# Patient Record
Sex: Female | Born: 1980 | Race: Asian | Hispanic: No | Marital: Married | Smoking: Never smoker
Health system: Southern US, Community
[De-identification: ages and names within clinical notes are randomized; demographics above are authoritative.]

## PROBLEM LIST (undated history)

## (undated) DIAGNOSIS — F410 Panic disorder [episodic paroxysmal anxiety] without agoraphobia: Secondary | ICD-10-CM

---

## 2018-03-23 ENCOUNTER — Emergency Department (HOSPITAL_COMMUNITY)
Admission: EM | Admit: 2018-03-23 | Discharge: 2018-03-23 | Disposition: A | Discharge status: Home / Self Care | Payer: BLUE CROSS/BLUE SHIELD | Attending: Emergency Medicine | Admitting: Emergency Medicine

## 2018-03-23 ENCOUNTER — Encounter (HOSPITAL_COMMUNITY): Payer: Self-pay

## 2018-03-23 ENCOUNTER — Other Ambulatory Visit: Payer: Self-pay

## 2018-03-23 ENCOUNTER — Emergency Department (HOSPITAL_COMMUNITY): Payer: BLUE CROSS/BLUE SHIELD

## 2018-03-23 DIAGNOSIS — Z3202 Encounter for pregnancy test, result negative: Secondary | ICD-10-CM | POA: Insufficient documentation

## 2018-03-23 DIAGNOSIS — R404 Transient alteration of awareness: Secondary | ICD-10-CM | POA: Diagnosis not present

## 2018-03-23 DIAGNOSIS — E876 Hypokalemia: Secondary | ICD-10-CM | POA: Insufficient documentation

## 2018-03-23 DIAGNOSIS — R1013 Epigastric pain: Secondary | ICD-10-CM | POA: Diagnosis not present

## 2018-03-23 DIAGNOSIS — F430 Acute stress reaction: Secondary | ICD-10-CM | POA: Diagnosis not present

## 2018-03-23 DIAGNOSIS — R0602 Shortness of breath: Secondary | ICD-10-CM | POA: Diagnosis not present

## 2018-03-23 DIAGNOSIS — D72829 Elevated white blood cell count, unspecified: Secondary | ICD-10-CM | POA: Insufficient documentation

## 2018-03-23 DIAGNOSIS — R0902 Hypoxemia: Secondary | ICD-10-CM | POA: Diagnosis not present

## 2018-03-23 DIAGNOSIS — R0689 Other abnormalities of breathing: Secondary | ICD-10-CM | POA: Diagnosis not present

## 2018-03-23 DIAGNOSIS — R55 Syncope and collapse: Secondary | ICD-10-CM | POA: Insufficient documentation

## 2018-03-23 HISTORY — DX: Panic disorder (episodic paroxysmal anxiety): F41.0

## 2018-03-23 LAB — URINALYSIS, ROUTINE W REFLEX MICROSCOPIC
Bilirubin Urine: NEGATIVE
Glucose, UA: 50 mg/dL — AB
Hgb urine dipstick: NEGATIVE
KETONES UR: NEGATIVE mg/dL
Nitrite: NEGATIVE
PROTEIN: NEGATIVE mg/dL
Specific Gravity, Urine: 1.011 (ref 1.005–1.030)
pH: 7 (ref 5.0–8.0)

## 2018-03-23 LAB — CBC
HEMATOCRIT: 44.1 % (ref 36.0–46.0)
Hemoglobin: 13.7 g/dL (ref 12.0–15.0)
MCH: 27.5 pg (ref 26.0–34.0)
MCHC: 31.1 g/dL (ref 30.0–36.0)
MCV: 88.6 fL (ref 80.0–100.0)
Platelets: 173 10*3/uL (ref 150–400)
RBC: 4.98 MIL/uL (ref 3.87–5.11)
RDW: 12 % (ref 11.5–15.5)
WBC: 17 10*3/uL — ABNORMAL HIGH (ref 4.0–10.5)
nRBC: 0 % (ref 0.0–0.2)

## 2018-03-23 LAB — BASIC METABOLIC PANEL
Anion gap: 10 (ref 5–15)
BUN: 15 mg/dL (ref 6–20)
CO2: 22 mmol/L (ref 22–32)
CREATININE: 0.82 mg/dL (ref 0.44–1.00)
Calcium: 9.2 mg/dL (ref 8.9–10.3)
Chloride: 108 mmol/L (ref 98–111)
GFR calc Af Amer: >60 mL/min (ref >60)
GFR calc non Af Amer: >60 mL/min (ref >60)
Glucose, Bld: 170 mg/dL — ABNORMAL HIGH (ref 70–99)
Potassium: 3.2 mmol/L — ABNORMAL LOW (ref 3.5–5.1)
Sodium: 140 mmol/L (ref 135–145)

## 2018-03-23 LAB — CBG MONITORING, ED: Glucose-Capillary: 152 mg/dL — ABNORMAL HIGH (ref 70–99)

## 2018-03-23 LAB — I-STAT BETA HCG BLOOD, ED (MC, WL, AP ONLY): I-stat hCG, quantitative: <5 m[IU]/mL (ref <5)

## 2018-03-23 LAB — D-DIMER, QUANTITATIVE: D-Dimer, Quant: 0.31 ug/mL-FEU (ref 0.00–0.50)

## 2018-03-23 MED ORDER — HYDROXYZINE HCL 25 MG PO TABS: 25.0000 mg | ORAL_TABLET | Freq: Four times a day (QID) | ORAL | 0 refills | Status: DC

## 2018-03-23 MED ORDER — PANTOPRAZOLE SODIUM 20 MG PO TBEC: 20.0000 mg | DELAYED_RELEASE_TABLET | Freq: Every day | ORAL | 0 refills | Status: AC

## 2018-03-23 MED ORDER — POTASSIUM CHLORIDE CRYS ER 20 MEQ PO TBCR
40.0000 meq | EXTENDED_RELEASE_TABLET | Freq: Once | ORAL | Status: AC
  Administered 2018-03-23: 40 meq via ORAL
  Filled 2018-03-23: qty 2

## 2018-03-23 NOTE — ED Provider Notes (Signed)
[facility]-EMERGENCY DEPT Provider Note   CSN: [id number] Arrival date & time: 03/23/18  1501     History   Chief Complaint Chief Complaint  Patient presents with  . Loss of Consciousness  . Panic Attack    HPI Linda Neal is a 37 y.o. female.  HPI  Patient is a 37 year old female with no significant past medical history presenting for episode of shortness of breath, diaphoresis, and syncope versus near syncopal episode.  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  Patient is a IT consultant at [employer].  Patient reports that she was walking with her professor and could not keep up due to having some epigastric pain.  She reports that she frequently gets this pain.  She reports that she had a busy morning, grading papers and trying to complete them on time.  Patient reports that her husband came to pick her up due to the epigastric discomfort she was experiencing and took her to the store.  During this time, patient became suddenly short of breath, and her husband reports that while in the car she "looked like she could not breathe".  EMS report stating that patient had a syncopal episode on the stretcher.  On presentation emergency department, patient reported she is back to baseline.  She is no longer experiencing abdominal pain or shortness of breath.  No chest pain. But she does report that she has been under significant stress due to completing her graduate program as well as the duties of teaching.  She reports that she has a history of similar episodes, however they happen infrequently, she does not know what triggers them.  No psychiatric history.  Patient denies any recent mobilization, hospitalization, hormone use, cancer treatment, history DVT/PE, lower extremity edema or calf tenderness, or hemoptysis.  No recent illnesses.  Past Medical History:  Diagnosis Date  . Panic attack     There are no active problems to display  for this patient.   Past Surgical History:  Procedure Laterality Date  . CESAREAN SECTION       OB History   No obstetric history on file.      Home Medications    Prior to Admission medications   Not on File    Family History Family History  Problem Relation Age of Onset  . Hypertension Mother   . Hypertension Father     Social History Social History   Tobacco Use  . Smoking status: Never Smoker  . Smokeless tobacco: Never Used  Substance Use Topics  . Alcohol use: Never    Frequency: Never  . Drug use: Never     Allergies   Patient has no known allergies.   Review of Systems Review of Systems  Constitutional: Negative for chills and fever.  HENT: Negative for congestion, rhinorrhea, sinus pain and sore throat.   Eyes: Negative for visual disturbance.  Respiratory: Positive for shortness of breath. Negative for cough and chest tightness.   Cardiovascular: Negative for chest pain, palpitations and leg swelling.  Gastrointestinal: Negative for abdominal pain, nausea and vomiting.  Genitourinary: Negative for dysuria and flank pain.  Musculoskeletal: Negative for back pain and myalgias.  Skin: Negative for rash.  Neurological: Negative for dizziness, syncope, light-headedness and headaches.       +Near syncope     Physical Exam Updated Vital Signs BP 107/64   Pulse 77   Temp 98.4 F (36.9 C) (Oral)   Resp  17   Ht 5\' 3"  (1.6 m)   Wt 45.4 kg   LMP 03/07/2018   SpO2 100%   BMI 17.71 kg/m   Physical Exam Vitals signs and nursing note reviewed.  Constitutional:      General: She is not in acute distress.    Appearance: She is well-developed.  HENT:     Head: Normocephalic and atraumatic.  Eyes:     Conjunctiva/sclera: Conjunctivae normal.     Pupils: Pupils are equal, round, and reactive to light.  Neck:     Musculoskeletal: Normal range of motion and neck supple.  Cardiovascular:     Rate and Rhythm: Normal rate and regular rhythm.      Heart sounds: S1 normal and S2 normal. No murmur.     Comments: No lower extremity edema.  No calf tenderness. Pulmonary:     Effort: Pulmonary effort is normal.     Breath sounds: Normal breath sounds. No wheezing or rales.  Abdominal:     General: There is no distension.     Palpations: Abdomen is soft.     Tenderness: There is no abdominal tenderness. There is no guarding.  Musculoskeletal: Normal range of motion.        General: No deformity.  Lymphadenopathy:     Cervical: No cervical adenopathy.  Skin:    General: Skin is warm and dry.     Findings: No erythema or rash.  Neurological:     Mental Status: She is alert.     Comments: Cranial nerves grossly intact. Patient moves extremities symmetrically and with good coordination.  Psychiatric:        Behavior: Behavior normal.        Thought Content: Thought content normal.        Judgment: Judgment normal.      ED Treatments / Results  Labs (all labs ordered are listed, but only abnormal results are displayed) Labs Reviewed  BASIC METABOLIC PANEL - Abnormal; Notable for the following components:      Result Value   Potassium 3.2 (*)    Glucose, Bld 170 (*)    All other components within normal limits  CBC - Abnormal; Notable for the following components:   WBC 17.0 (*)    All other components within normal limits  URINALYSIS, ROUTINE W REFLEX MICROSCOPIC - Abnormal; Notable for the following components:   Glucose, UA 50 (*)    Leukocytes, UA SMALL (*)    Bacteria, UA RARE (*)    All other components within normal limits  CBG MONITORING, ED - Abnormal; Notable for the following components:   Glucose-Capillary 152 (*)    All other components within normal limits  D-DIMER, QUANTITATIVE (NOT AT [facility])  I-STAT BETA HCG BLOOD, ED ([facility], [facility], [facility] ONLY)    EKG EKG Interpretation  Date/Time:  Thursday March 23 2018 17:13:13 EST Ventricular Rate:  85 PR Interval:    QRS Duration: 84 QT Interval:  377 QTC  Calculation: 449 R Axis:   72 Text Interpretation:  Sinus rhythm RSR' in V1 or V2, probably normal variant No old tracing to compare Confirmed by Mancel Bale [phone]) on 03/23/2018 6:08:08 PM   Radiology Dg Chest 2 View  Result Date: 03/23/2018 CLINICAL DATA:  Shortness of breath EXAM: CHEST - 2 VIEW COMPARISON:  None. FINDINGS: The heart size and mediastinal contours are within normal limits. Both lungs are clear. The visualized skeletal structures are unremarkable. IMPRESSION: No active cardiopulmonary disease. Electronically Signed   By:  Jasmine PangKim  Fujinaga M.D.   On: 03/23/2018 19:29    Procedures Procedures (including critical care time)  Medications Ordered in ED Medications  potassium chloride SA (K-DUR,KLOR-CON) CR tablet 40 mEq (40 mEq Oral Given 03/23/18 1854)     Initial Impression / Assessment and Plan / ED Course  I have reviewed the triage vital signs and the nursing notes.  Pertinent labs & imaging results that were available during my care of the patient were reviewed by me and considered in my medical decision making (see chart for details).  Clinical Course as of Mar 25 119  Thu Mar 23, 2018  1705 No history of diabetes.   Glucose(!): 170 [AM]  2027 Repleted.   Potassium(!): 3.2 [AM]    Clinical Course User Index [AM] Elisha PonderMurray, Araf Clugston B, PA-C    Patient nontoxic-appearing, afebrile, hemodynamically stable my evaluation.  Differential diagnosis includes panic attack, pulmonary embolism, dyspepsia, vasovagal reaction.  Patient does not appear to have infectious symptoms today.  Patient is back to baseline in no acute distress at rest.  She is reporting some continued sensation of slight shortness of breath when ambulating.  Work-up revealing leukocytosis of 17.  No prior for comparison.  Possible stress reaction, however recommended follow-up to patient with PCP.  Patient had hyperglycemia as well as small amount of glucose in the urine.  Recommended repeat follow-up  with PCP to rule out prediabetes/diabetes.  Patient is not pregnant.  Slight hypokalemia, repleted.  D-dimer negative.  Chest x-ray without acute cardiopulmonary abnormality.  EKG, with no prior for comparison demonstrating no acute ischemic findings or arrhythmia.  Patient was ambulated, and had a pulse rate of 94 with oxygen saturation to 95.  Patient remained above 95% oxygen saturation otherwise.  Do not feel that patient is at further risk for pulmonary bills at this time and is fully cleared from a cardiopulmonary perspective.  Patient struck to establish care with a primary care provider soon as possible, as well as follow-up [facility].  Patient given hydroxyzine is symptomatic treatment for any episodes of increased rest or panic.  Patient also started on proton pump inhibitor given history clinically consistent with dyspepsia.   Final Clinical Impressions(s) / ED Diagnoses   Final diagnoses:  Shortness of breath  Leukocytosis, unspecified type  Hypokalemia  Acute stress reaction    ED Discharge Orders         Ordered    hydrOXYzine (ATARAX/VISTARIL) 25 MG tablet  Every 6 hours     03/23/18 2034    pantoprazole (PROTONIX) 20 MG tablet  Daily     03/23/18 2034           Elisha PonderMurray, Ellia Knowlton B, PA-C 03/24/18 0132    Mancel BaleWentz, Elliott, MD 03/24/18 1312

## 2018-03-23 NOTE — ED Triage Notes (Signed)
Per EMS- Patient  Is a Consulting civil engineerstudent at [employer]. Patient had a panic attack when a professor upset her. Patient had LOC while on EMS stretcher. Patient has a history of anxiety. Patient alert and oriented and ambulatory.

## 2018-03-23 NOTE — Discharge Instructions (Addendum)
Please see the information and instructions below regarding your visit.  Your diagnoses today include:  1. Shortness of breath   2. Leukocytosis, unspecified type   3. Hypokalemia   4. Acute stress reaction     Tests performed today include: See side panel of your discharge paperwork for testing performed today. Vital signs are listed at the bottom of these instructions.   Medications prescribed:    Take any prescribed medications only as prescribed, and any over the counter medications only as directed on the packaging.  We start a medication called Atarax or hydroxyzine.  This is a medication to help with episodic anxiety.  Just like Benadryl, can make you sleepy, so do not drive, drink alcohol, operate machinery while initially taking this medication.  You are prescribed Protonix.  This is a proton pump inhibitor.  This will help with any acid in your stomach.  On testing today, your potassium level was 3.2 which is slightly below normal. I suspect this is due to nutrition. Increasing potassium in your diet should be sufficient to make sure you are getting enough potassium. Below is a list of good sources and servings high in potassium (in order from greatest to least):  -1 cup cooked acorn squash -1 baked potato with skin -1 cup cooked spinach -1 cup cooked lentils -1 cup cooked kidney beans -Watermelon - cup raisins -1 cup plain yogurt -1 cup orange juice, frozen -Banana -1 cup 1% low-fat milk -1 cup cooked broccoli  Home care instructions:  Please follow any educational materials contained in this packet.   Follow-up instructions: Please follow-up with your primary care provider as soon as possible for further evaluation of your symptoms if they are not completely improved.   Please follow up with [employer] counseling.   To find a primary care or specialty doctor please call [phone] or [phone] to access "[facility] Find a Doctor Service."  You may also go  on the [facility] website at [email or web address]  There are also multiple [facility] and [facility] practices throughout the [place] that are frequently accepting new patients. You may find a clinic that is close to your home and contact them.  [facility] - [address] [place] [phone]  [facility] in [place] (also locations in [place] and [place]) - [address] [place] [phone]  [facility] - [address] [phone]    Return instructions:  Please return to the Emergency Department if you experience worsening symptoms.  Please return to the emergency department if you develop any worsening shortness of breath, chest pain, difficulty breathing, feeling that you are going to pass out, or any further symptoms. Please return if you have any other emergent concerns.  Additional Information:   Your vital signs today were: BP 107/64    Pulse 77    Temp 98.4 F (36.9 C) (Oral)    Resp 17    Ht 5\' 3"  (1.6 m)    Wt 45.4 kg    LMP 03/07/2018    SpO2 100%    BMI 17.71 kg/m  If your blood pressure (BP) was elevated on multiple readings during this visit above 130 for the top number or above 80 for the bottom number, please have this repeated by your primary care provider within one month. --------------  Thank you for allowing us to participate in your care today.

## 2018-03-31 DIAGNOSIS — R1013 Epigastric pain: Secondary | ICD-10-CM | POA: Diagnosis not present

## 2018-03-31 DIAGNOSIS — R740 Nonspecific elevation of levels of transaminase and lactic acid dehydrogenase [LDH]: Secondary | ICD-10-CM | POA: Diagnosis not present

## 2018-03-31 DIAGNOSIS — D72829 Elevated white blood cell count, unspecified: Secondary | ICD-10-CM | POA: Diagnosis not present

## 2018-04-03 DIAGNOSIS — R1013 Epigastric pain: Secondary | ICD-10-CM | POA: Diagnosis not present

## 2018-04-04 ENCOUNTER — Other Ambulatory Visit: Payer: Self-pay | Admitting: Family Medicine

## 2018-04-04 DIAGNOSIS — R1013 Epigastric pain: Secondary | ICD-10-CM

## 2018-04-04 DIAGNOSIS — R7401 Elevation of levels of liver transaminase levels: Secondary | ICD-10-CM

## 2018-04-04 DIAGNOSIS — R740 Nonspecific elevation of levels of transaminase and lactic acid dehydrogenase [LDH]: Principal | ICD-10-CM

## 2018-04-11 ENCOUNTER — Other Ambulatory Visit: Payer: BLUE CROSS/BLUE SHIELD

## 2018-04-14 ENCOUNTER — Ambulatory Visit
Admission: RE | Admit: 2018-04-14 | Discharge: 2018-04-14 | Disposition: A | Discharge status: Home / Self Care | Payer: BLUE CROSS/BLUE SHIELD | Source: Ambulatory Visit | Attending: Family Medicine | Admitting: Family Medicine

## 2018-04-14 DIAGNOSIS — R7401 Elevation of levels of liver transaminase levels: Secondary | ICD-10-CM

## 2018-04-14 DIAGNOSIS — R1013 Epigastric pain: Secondary | ICD-10-CM

## 2018-04-14 DIAGNOSIS — R740 Nonspecific elevation of levels of transaminase and lactic acid dehydrogenase [LDH]: Principal | ICD-10-CM

## 2018-04-14 DIAGNOSIS — K8020 Calculus of gallbladder without cholecystitis without obstruction: Secondary | ICD-10-CM | POA: Diagnosis not present

## 2018-04-21 DIAGNOSIS — K8010 Calculus of gallbladder with chronic cholecystitis without obstruction: Secondary | ICD-10-CM | POA: Diagnosis not present

## 2018-06-01 DIAGNOSIS — K8020 Calculus of gallbladder without cholecystitis without obstruction: Secondary | ICD-10-CM | POA: Diagnosis not present

## 2018-06-20 ENCOUNTER — Ambulatory Visit: Payer: Self-pay | Admitting: General Surgery

## 2018-08-22 ENCOUNTER — Ambulatory Visit: Payer: Self-pay | Admitting: General Surgery

## 2018-09-07 ENCOUNTER — Ambulatory Visit (HOSPITAL_BASED_OUTPATIENT_CLINIC_OR_DEPARTMENT_OTHER): Admit: 2018-09-07 | Payer: BLUE CROSS/BLUE SHIELD | Admitting: General Surgery

## 2018-09-07 ENCOUNTER — Encounter (HOSPITAL_BASED_OUTPATIENT_CLINIC_OR_DEPARTMENT_OTHER): Payer: Self-pay

## 2018-09-07 SURGERY — LAPAROSCOPIC CHOLECYSTECTOMY SINGLE SITE WITH INTRAOPERATIVE CHOLANGIOGRAM
Anesthesia: General

## 2018-09-12 ENCOUNTER — Encounter (HOSPITAL_COMMUNITY): Payer: Self-pay | Admitting: *Deleted

## 2018-09-12 ENCOUNTER — Other Ambulatory Visit: Payer: Self-pay

## 2018-09-12 NOTE — Progress Notes (Signed)
Linda Neal denies chest pain or shortness of breath.Patient denies that she nor her family has experienced any of the following: Cough Fever >100.4 Runny Nose Sore Throat Difficulty breathing/ shortness of breath Travel in past 14 days- no Patient is scheduled for COVID test 09/13/2018 at 1435- patient is aware that she is to go home with her core family and social diastance.  I instructed patient to not eat after midnight Wednesday night.  I instructed patient that she can have clear liquids until 4 :39 am.  I gave patient examples of clear liquids: Carbonated Beverages, Water, Clear Tea, Black Coffee only, Juice (non-citric and without pulp), Gatorade, Plain Jell-O  only, Plain Popsicles only. Linda Neal said she would drink apple juice before 4:30 AM.

## 2018-09-13 ENCOUNTER — Other Ambulatory Visit (HOSPITAL_COMMUNITY)
Admission: RE | Admit: 2018-09-13 | Discharge: 2018-09-13 | Disposition: A | Discharge status: Home / Self Care | Payer: BC Managed Care – PPO | Source: Ambulatory Visit | Attending: General Surgery | Admitting: General Surgery

## 2018-09-13 DIAGNOSIS — Z1159 Encounter for screening for other viral diseases: Secondary | ICD-10-CM | POA: Diagnosis not present

## 2018-09-13 LAB — SARS CORONAVIRUS 2 BY RT PCR (HOSPITAL ORDER, PERFORMED IN ~~LOC~~ HOSPITAL LAB): SARS Coronavirus 2: NEGATIVE

## 2018-09-14 ENCOUNTER — Ambulatory Visit (HOSPITAL_COMMUNITY): Payer: BC Managed Care – PPO | Admitting: Anesthesiology

## 2018-09-14 ENCOUNTER — Ambulatory Visit (HOSPITAL_COMMUNITY): Payer: BC Managed Care – PPO

## 2018-09-14 ENCOUNTER — Encounter (HOSPITAL_COMMUNITY): Payer: Self-pay

## 2018-09-14 ENCOUNTER — Encounter (HOSPITAL_COMMUNITY)
Admission: RE | Disposition: A | Discharge status: Home / Self Care | Payer: Self-pay | Source: Home / Self Care | Attending: General Surgery

## 2018-09-14 ENCOUNTER — Ambulatory Visit (HOSPITAL_COMMUNITY)
Admission: RE | Admit: 2018-09-14 | Discharge: 2018-09-14 | Disposition: A | Discharge status: Home / Self Care | Payer: BC Managed Care – PPO | Attending: General Surgery | Admitting: General Surgery

## 2018-09-14 ENCOUNTER — Other Ambulatory Visit: Payer: Self-pay

## 2018-09-14 DIAGNOSIS — K8020 Calculus of gallbladder without cholecystitis without obstruction: Secondary | ICD-10-CM | POA: Diagnosis not present

## 2018-09-14 DIAGNOSIS — Z79899 Other long term (current) drug therapy: Secondary | ICD-10-CM | POA: Insufficient documentation

## 2018-09-14 DIAGNOSIS — Z419 Encounter for procedure for purposes other than remedying health state, unspecified: Secondary | ICD-10-CM

## 2018-09-14 DIAGNOSIS — K8010 Calculus of gallbladder with chronic cholecystitis without obstruction: Secondary | ICD-10-CM | POA: Diagnosis not present

## 2018-09-14 DIAGNOSIS — K915 Postcholecystectomy syndrome: Secondary | ICD-10-CM | POA: Diagnosis not present

## 2018-09-14 HISTORY — PX: CHOLECYSTECTOMY: SHX55

## 2018-09-14 LAB — CBC
HCT: 39.4 % (ref 36.0–46.0)
Hemoglobin: 12.4 g/dL (ref 12.0–15.0)
MCH: 27.9 pg (ref 26.0–34.0)
MCHC: 31.5 g/dL (ref 30.0–36.0)
MCV: 88.5 fL (ref 80.0–100.0)
Platelets: 178 10*3/uL (ref 150–400)
RBC: 4.45 MIL/uL (ref 3.87–5.11)
RDW: 11.9 % (ref 11.5–15.5)
WBC: 4.9 10*3/uL (ref 4.0–10.5)
nRBC: 0 % (ref 0.0–0.2)

## 2018-09-14 LAB — POCT PREGNANCY, URINE: Preg Test, Ur: NEGATIVE

## 2018-09-14 SURGERY — LAPAROSCOPIC CHOLECYSTECTOMY WITH INTRAOPERATIVE CHOLANGIOGRAM
Anesthesia: General | Site: Abdomen

## 2018-09-14 MED ORDER — PROPOFOL 10 MG/ML IV BOLUS
INTRAVENOUS | Status: AC
  Filled 2018-09-14: qty 20

## 2018-09-14 MED ORDER — MIDAZOLAM HCL 2 MG/2ML IJ SOLN
INTRAMUSCULAR | Status: AC
  Filled 2018-09-14: qty 2

## 2018-09-14 MED ORDER — KETOROLAC TROMETHAMINE 30 MG/ML IJ SOLN
INTRAMUSCULAR | Status: AC
  Filled 2018-09-14: qty 1

## 2018-09-14 MED ORDER — OXYCODONE HCL 5 MG/5ML PO SOLN: 5.0000 mg | Freq: Once | ORAL | Status: DC | PRN

## 2018-09-14 MED ORDER — GABAPENTIN 300 MG PO CAPS
ORAL_CAPSULE | ORAL | Status: AC
  Administered 2018-09-14: 06:00:00 300 mg via ORAL
  Filled 2018-09-14: qty 1

## 2018-09-14 MED ORDER — BUPIVACAINE-EPINEPHRINE 0.25% -1:200000 IJ SOLN
INTRAMUSCULAR | Status: DC | PRN
  Administered 2018-09-14: 30 mL

## 2018-09-14 MED ORDER — SODIUM CHLORIDE 0.9 % IR SOLN
Status: DC | PRN
  Administered 2018-09-14: 1000 mL

## 2018-09-14 MED ORDER — ACETAMINOPHEN 500 MG PO TABS
1000.0000 mg | ORAL_TABLET | ORAL | Status: AC
  Administered 2018-09-14: 1000 mg via ORAL

## 2018-09-14 MED ORDER — LIDOCAINE 2% (20 MG/ML) 5 ML SYRINGE
INTRAMUSCULAR | Status: DC | PRN
  Administered 2018-09-14: 60 mg via INTRAVENOUS

## 2018-09-14 MED ORDER — ROCURONIUM BROMIDE 10 MG/ML (PF) SYRINGE
PREFILLED_SYRINGE | INTRAVENOUS | Status: DC | PRN
  Administered 2018-09-14: 50 mg via INTRAVENOUS
  Administered 2018-09-14: 10 mg via INTRAVENOUS

## 2018-09-14 MED ORDER — SODIUM CHLORIDE 0.9 % IV SOLN
INTRAVENOUS | Status: AC
  Filled 2018-09-14: qty 2

## 2018-09-14 MED ORDER — MIDAZOLAM HCL 2 MG/2ML IJ SOLN
INTRAMUSCULAR | Status: DC | PRN
  Administered 2018-09-14: 2 mg via INTRAVENOUS

## 2018-09-14 MED ORDER — FENTANYL CITRATE (PF) 100 MCG/2ML IJ SOLN
INTRAMUSCULAR | Status: DC | PRN
  Administered 2018-09-14: 100 ug via INTRAVENOUS
  Administered 2018-09-14 (×2): 25 ug via INTRAVENOUS

## 2018-09-14 MED ORDER — PROPOFOL 10 MG/ML IV BOLUS
INTRAVENOUS | Status: DC | PRN
  Administered 2018-09-14: 110 mg via INTRAVENOUS

## 2018-09-14 MED ORDER — FENTANYL CITRATE (PF) 100 MCG/2ML IJ SOLN: 25.0000 ug | INTRAMUSCULAR | Status: DC | PRN

## 2018-09-14 MED ORDER — DEXAMETHASONE SODIUM PHOSPHATE 10 MG/ML IJ SOLN
INTRAMUSCULAR | Status: DC | PRN
  Administered 2018-09-14: 4 mg via INTRAVENOUS

## 2018-09-14 MED ORDER — ONDANSETRON HCL 4 MG/2ML IJ SOLN
INTRAMUSCULAR | Status: DC | PRN
  Administered 2018-09-14: 4 mg via INTRAVENOUS

## 2018-09-14 MED ORDER — ROCURONIUM BROMIDE 10 MG/ML (PF) SYRINGE
PREFILLED_SYRINGE | INTRAVENOUS | Status: AC
  Filled 2018-09-14: qty 10

## 2018-09-14 MED ORDER — ACETAMINOPHEN 500 MG PO TABS
ORAL_TABLET | ORAL | Status: AC
  Administered 2018-09-14: 06:00:00 1000 mg via ORAL
  Filled 2018-09-14: qty 2

## 2018-09-14 MED ORDER — ONDANSETRON HCL 4 MG/2ML IJ SOLN: 4.0000 mg | Freq: Four times a day (QID) | INTRAMUSCULAR | Status: DC | PRN

## 2018-09-14 MED ORDER — SUGAMMADEX SODIUM 200 MG/2ML IV SOLN
INTRAVENOUS | Status: DC | PRN
  Administered 2018-09-14: 180 mg via INTRAVENOUS

## 2018-09-14 MED ORDER — KETOROLAC TROMETHAMINE 30 MG/ML IJ SOLN
INTRAMUSCULAR | Status: DC | PRN
  Administered 2018-09-14: 30 mg via INTRAVENOUS

## 2018-09-14 MED ORDER — NALOXONE HCL 0.4 MG/ML IJ SOLN
INTRAMUSCULAR | Status: DC | PRN
  Administered 2018-09-14: 100 ug via INTRAVENOUS

## 2018-09-14 MED ORDER — 0.9 % SODIUM CHLORIDE (POUR BTL) OPTIME
TOPICAL | Status: DC | PRN
  Administered 2018-09-14: 08:00:00 1000 mL

## 2018-09-14 MED ORDER — FENTANYL CITRATE (PF) 250 MCG/5ML IJ SOLN
INTRAMUSCULAR | Status: AC
  Filled 2018-09-14: qty 5

## 2018-09-14 MED ORDER — STERILE WATER FOR IRRIGATION IR SOLN
Status: DC | PRN
  Administered 2018-09-14: 1000 mL

## 2018-09-14 MED ORDER — LACTATED RINGERS IV SOLN
INTRAVENOUS | Status: DC | PRN
  Administered 2018-09-14 (×2): via INTRAVENOUS

## 2018-09-14 MED ORDER — CHLORHEXIDINE GLUCONATE CLOTH 2 % EX PADS: 6.0000 | MEDICATED_PAD | Freq: Once | CUTANEOUS | Status: DC

## 2018-09-14 MED ORDER — SODIUM CHLORIDE 0.9 % IV SOLN
2.0000 g | INTRAVENOUS | Status: AC
  Administered 2018-09-14: 2 g via INTRAVENOUS

## 2018-09-14 MED ORDER — OXYCODONE HCL 5 MG PO TABS: 5.0000 mg | ORAL_TABLET | Freq: Once | ORAL | Status: DC | PRN

## 2018-09-14 MED ORDER — SODIUM CHLORIDE 0.9 % IV SOLN
INTRAVENOUS | Status: DC | PRN
  Administered 2018-09-14: 9 mL

## 2018-09-14 MED ORDER — ONDANSETRON HCL 4 MG/2ML IJ SOLN
INTRAMUSCULAR | Status: AC
  Filled 2018-09-14: qty 2

## 2018-09-14 MED ORDER — GABAPENTIN 300 MG PO CAPS
300.0000 mg | ORAL_CAPSULE | ORAL | Status: AC
  Administered 2018-09-14: 06:00:00 300 mg via ORAL

## 2018-09-14 MED ORDER — LIDOCAINE 2% (20 MG/ML) 5 ML SYRINGE
INTRAMUSCULAR | Status: AC
  Filled 2018-09-14: qty 5

## 2018-09-14 MED ORDER — OXYCODONE HCL 5 MG PO TABS: 5.0000 mg | ORAL_TABLET | Freq: Four times a day (QID) | ORAL | 0 refills | Status: AC | PRN

## 2018-09-14 MED ORDER — NALOXONE HCL 0.4 MG/ML IJ SOLN
INTRAMUSCULAR | Status: AC
  Filled 2018-09-14: qty 1

## 2018-09-14 MED ORDER — SUCCINYLCHOLINE CHLORIDE 200 MG/10ML IV SOSY
PREFILLED_SYRINGE | INTRAVENOUS | Status: AC
  Filled 2018-09-14: qty 10

## 2018-09-14 MED ORDER — DEXAMETHASONE SODIUM PHOSPHATE 10 MG/ML IJ SOLN
INTRAMUSCULAR | Status: AC
  Filled 2018-09-14: qty 1

## 2018-09-14 MED ORDER — BUPIVACAINE-EPINEPHRINE (PF) 0.25% -1:200000 IJ SOLN
INTRAMUSCULAR | Status: AC
  Filled 2018-09-14: qty 30

## 2018-09-14 SURGICAL SUPPLY — 46 items
APPLIER CLIP 5 13 M/L LIGAMAX5 (MISCELLANEOUS) ×3
BANDAGE ADH SHEER 1  50/CT (GAUZE/BANDAGES/DRESSINGS) ×3 IMPLANT
BENZOIN TINCTURE PRP APPL 2/3 (GAUZE/BANDAGES/DRESSINGS) ×3 IMPLANT
CANISTER SUCT 3000ML PPV (MISCELLANEOUS) ×3 IMPLANT
CHLORAPREP W/TINT 26 (MISCELLANEOUS) ×3 IMPLANT
CLIP APPLIE 5 13 M/L LIGAMAX5 (MISCELLANEOUS) ×1 IMPLANT
CLOSURE WOUND 1/2 X4 (GAUZE/BANDAGES/DRESSINGS) ×1
COVER MAYO STAND STRL (DRAPES) ×3 IMPLANT
COVER SURGICAL LIGHT HANDLE (MISCELLANEOUS) ×3 IMPLANT
COVER WAND RF STERILE (DRAPES) ×3 IMPLANT
DERMABOND ADVANCED (GAUZE/BANDAGES/DRESSINGS) ×2
DERMABOND ADVANCED .7 DNX12 (GAUZE/BANDAGES/DRESSINGS) ×1 IMPLANT
DRAPE C-ARM 42X72 X-RAY (DRAPES) ×3 IMPLANT
DRSG TEGADERM 4X4.75 (GAUZE/BANDAGES/DRESSINGS) ×3 IMPLANT
ELECT REM PT RETURN 9FT ADLT (ELECTROSURGICAL) ×3
ELECTRODE REM PT RTRN 9FT ADLT (ELECTROSURGICAL) ×1 IMPLANT
GAUZE SPONGE 2X2 8PLY STRL LF (GAUZE/BANDAGES/DRESSINGS) ×1 IMPLANT
GLOVE BIOGEL M STRL SZ7.5 (GLOVE) ×3 IMPLANT
GLOVE BIOGEL PI IND STRL 6.5 (GLOVE) ×1 IMPLANT
GLOVE BIOGEL PI INDICATOR 6.5 (GLOVE) ×2
GLOVE INDICATOR 8.0 STRL GRN (GLOVE) ×3 IMPLANT
GOWN STRL REUS W/ TWL LRG LVL3 (GOWN DISPOSABLE) ×3 IMPLANT
GOWN STRL REUS W/TWL 2XL LVL3 (GOWN DISPOSABLE) ×3 IMPLANT
GOWN STRL REUS W/TWL LRG LVL3 (GOWN DISPOSABLE) ×6
GRASPER SUT TROCAR 14GX15 (MISCELLANEOUS) ×3 IMPLANT
KIT BASIN OR (CUSTOM PROCEDURE TRAY) ×3 IMPLANT
KIT TURNOVER KIT B (KITS) ×3 IMPLANT
NS IRRIG 1000ML POUR BTL (IV SOLUTION) ×3 IMPLANT
PAD ARMBOARD 7.5X6 YLW CONV (MISCELLANEOUS) ×3 IMPLANT
POUCH RETRIEVAL ECOSAC 10 (ENDOMECHANICALS) ×1 IMPLANT
POUCH RETRIEVAL ECOSAC 10MM (ENDOMECHANICALS) ×2
SCISSORS LAP 5X35 DISP (ENDOMECHANICALS) ×3 IMPLANT
SET CHOLANGIOGRAPH 5 50 .035 (SET/KITS/TRAYS/PACK) ×3 IMPLANT
SET IRRIG TUBING LAPAROSCOPIC (IRRIGATION / IRRIGATOR) ×3 IMPLANT
SET TUBE SMOKE EVAC HIGH FLOW (TUBING) ×3 IMPLANT
SLEEVE ENDOPATH XCEL 5M (ENDOMECHANICALS) ×6 IMPLANT
SPECIMEN JAR SMALL (MISCELLANEOUS) ×3 IMPLANT
SPONGE GAUZE 2X2 STER 10/PKG (GAUZE/BANDAGES/DRESSINGS) ×2
STRIP CLOSURE SKIN 1/2X4 (GAUZE/BANDAGES/DRESSINGS) ×2 IMPLANT
SUT MNCRL AB 4-0 PS2 18 (SUTURE) ×3 IMPLANT
SUT VICRYL 0 UR6 27IN ABS (SUTURE) ×3 IMPLANT
TOWEL GREEN STERILE FF (TOWEL DISPOSABLE) ×3 IMPLANT
TRAY LAPAROSCOPIC MC (CUSTOM PROCEDURE TRAY) ×3 IMPLANT
TROCAR XCEL BLUNT TIP 100MML (ENDOMECHANICALS) ×3 IMPLANT
TROCAR XCEL NON-BLD 5MMX100MML (ENDOMECHANICALS) ×3 IMPLANT
WATER STERILE IRR 1000ML POUR (IV SOLUTION) ×3 IMPLANT

## 2018-09-14 NOTE — H&P (Signed)
Linda Neal is an 38 y.o. female.   Chief Complaint: here for surgery HPI: The patient is a 38 year old female who presents for evaluation of gall stones. She is referred by Dr Docia Chuck for evaluation of epigastric pain and gallstones. She had some episodes in December of epigastric pain that would last several hours generally after eating. She denied any nausea or vomiting. She has been eating a bland diet since then and really hasn't had an additional episode. She had elevated transaminases. Her AST was 52 and her ALT was 149. She had an abdominal ultrasound which showed gallstones without any dilation of her common bile duct. She denies any weight loss. She denies any acholic stools. She denies any NSAID use. She denies any fevers or chills. She is a Consulting civil engineer at [employer]  She denies any changes since I saw her in the clinic  Past Medical History:  Diagnosis Date  . Panic attack     Past Surgical History:  Procedure Laterality Date  . CESAREAN SECTION      Family History  Problem Relation Age of Onset  . Hypertension Mother   . Hypertension Father    Social History:  reports that she has never smoked. She has never used smokeless tobacco. She reports that she does not drink alcohol or use drugs.  Allergies: No Known Allergies  Medications Prior to Admission  Medication Sig Dispense Refill  . pantoprazole (PROTONIX) 20 MG tablet Take 1 tablet (20 mg total) by mouth daily. (Patient taking differently: Take 20 mg by mouth daily as needed for heartburn. ) 30 tablet 0  . hydrOXYzine (ATARAX/VISTARIL) 25 MG tablet Take 1 tablet (25 mg total) by mouth every 6 (six) hours. (Patient not taking: Reported on 09/11/2018) 12 tablet 0    Results for orders placed or performed during the hospital encounter of 09/14/18 (from the past 48 hour(s))  CBC     Status: None   Collection Time: 09/14/18  5:42 AM  Result Value Ref Range   WBC 4.9 4.0 - 10.5 K/uL   RBC 4.45 3.87 - 5.11 MIL/uL   Hemoglobin 12.4 12.0 - 15.0 g/dL   HCT 99.8 72.1 - 58.7 %   MCV 88.5 80.0 - 100.0 fL   MCH 27.9 26.0 - 34.0 pg   MCHC 31.5 30.0 - 36.0 g/dL   RDW 27.6 18.4 - 85.9 %   Platelets 178 150 - 400 K/uL   nRBC 0.0 0.0 - 0.2 %    Comment: Performed at [facility], [address], [place], [place] [address]  Pregnancy, urine POC     Status: None   Collection Time: 09/14/18  6:33 AM  Result Value Ref Range   Preg Test, Ur NEGATIVE NEGATIVE    Comment:        THE SENSITIVITY OF THIS METHODOLOGY IS >24 mIU/mL    No results found.  Review of Systems  All other systems reviewed and are negative.   Blood pressure 116/75, pulse 78, temperature 98.7 F (37.1 C), temperature source Oral, resp. rate 18, height 5\' 3"  (1.6 m), weight 45.4 kg, SpO2 100 %. Physical Exam  Vitals reviewed. Constitutional: She is oriented to person, place, and time. She appears well-developed and well-nourished. No distress.  HENT:  Head: Normocephalic and atraumatic.  Right Ear: External ear normal.  Left Ear: External ear normal.  Eyes: Conjunctivae are normal. No scleral icterus.  Neck: Normal range of motion. Neck supple. No tracheal deviation present. No thyromegaly  present.  Cardiovascular: Normal rate and normal heart sounds.  Respiratory: Effort normal and breath sounds normal. No stridor. No respiratory distress. She has no wheezes.  GI: Soft. She exhibits no distension. There is no abdominal tenderness. There is no rebound.  Musculoskeletal:        General: No tenderness or edema.  Lymphadenopathy:    She has no cervical adenopathy.  Neurological: She is alert and oriented to person, place, and time. She exhibits normal muscle tone.  Skin: Skin is warm and dry. No rash noted. She is not diaphoretic. No erythema. No pallor.  Psychiatric: She has a normal mood and affect. Her behavior is normal. Judgment and thought content normal.     Assessment/Plan Symptomatic cholelithiasis  To OR for lap  chole Previously discussed gb surgery in detail All questions asked/answered  Mary SellaEric M. Andrey CampanileWilson, MD, FACS General, Bariatric, & Minimally Invasive Surgery [facility] [place]    Gaynelle AduEric Alvia Jablonski, MD 09/14/2018, 7:19 AM

## 2018-09-14 NOTE — Anesthesia Procedure Notes (Signed)
Procedure Name: Intubation Date/Time: 09/14/2018 7:45 AM Performed by: Leonor Liv, CRNA Pre-anesthesia Checklist: Patient identified, Emergency Drugs available, Suction available and Patient being monitored Patient Re-evaluated:Patient Re-evaluated prior to induction Oxygen Delivery Method: Circle System Utilized Preoxygenation: Pre-oxygenation with 100% oxygen Induction Type: IV induction Ventilation: Mask ventilation without difficulty Laryngoscope Size: Mac and 3 Grade View: Grade II Tube type: Oral Tube size: 7.0 mm Number of attempts: 1 Airway Equipment and Method: Stylet and Oral airway Placement Confirmation: ETT inserted through vocal cords under direct vision,  positive ETCO2 and breath sounds checked- equal and bilateral Secured at: 21 cm Tube secured with: Tape Dental Injury: Teeth and Oropharynx as per pre-operative assessment

## 2018-09-14 NOTE — Transfer of Care (Signed)
Immediate Anesthesia Transfer of Care Note  Patient: Linda Neal  Procedure(s) Performed: LAPAROSCOPIC CHOLECYSTECTOMY WITH INTRAOPERATIVE CHOLANGIOGRAM (N/A Abdomen)  Patient Location: PACU  Anesthesia Type:General  Level of Consciousness: sedated  Airway & Oxygen Therapy: Patient Spontanous Breathing and Patient connected to nasal cannula oxygen  Post-op Assessment: Report given to RN and Post -op Vital signs reviewed and stable  Post vital signs: Reviewed and stable  Last Vitals:  Vitals Value Taken Time  BP 111/50 09/14/2018  9:30 AM  Temp 36.3 C 09/14/2018  9:30 AM  Pulse 103 09/14/2018  9:36 AM  Resp 23 09/14/2018  9:36 AM  SpO2 100 % 09/14/2018  9:36 AM  Vitals shown include unvalidated device data.  Last Pain:  Vitals:   09/14/18 0930  TempSrc:   PainSc: Asleep         Complications: No apparent anesthesia complications

## 2018-09-14 NOTE — Anesthesia Postprocedure Evaluation (Signed)
Anesthesia Post Note  Patient: Linda Neal  Procedure(s) Performed: LAPAROSCOPIC CHOLECYSTECTOMY WITH INTRAOPERATIVE CHOLANGIOGRAM (N/A Abdomen)     Patient location during evaluation: PACU Anesthesia Type: General Level of consciousness: awake and alert Pain management: pain level controlled Vital Signs Assessment: post-procedure vital signs reviewed and stable Respiratory status: spontaneous breathing, nonlabored ventilation, respiratory function stable and patient connected to nasal cannula oxygen Cardiovascular status: blood pressure returned to baseline and stable Postop Assessment: no apparent nausea or vomiting Anesthetic complications: no    Last Vitals:  Vitals:   09/14/18 1109 09/14/18 1110  BP:    Pulse: 76 82  Resp: (!) 21 17  Temp:    SpO2: 100% 100%    Last Pain:  Vitals:   09/14/18 1030  TempSrc:   PainSc: 0-No pain                 Porfirio Bollier S

## 2018-09-14 NOTE — Anesthesia Preprocedure Evaluation (Signed)
Anesthesia Evaluation  Patient identified by MRN, date of birth, ID band Patient awake    Reviewed: Allergy & Precautions, NPO status , Patient's Chart, lab work & pertinent test results  Airway Mallampati: II   Neck ROM: full    Dental   Pulmonary neg pulmonary ROS,    breath sounds clear to auscultation       Cardiovascular negative cardio ROS   Rhythm:regular Rate:Normal     Neuro/Psych PSYCHIATRIC DISORDERS Anxiety    GI/Hepatic   Endo/Other    Renal/GU      Musculoskeletal   Abdominal   Peds  Hematology   Anesthesia Other Findings   Reproductive/Obstetrics                             Anesthesia Physical Anesthesia Plan  ASA: II  Anesthesia Plan: General   Post-op Pain Management:    Induction: Intravenous  PONV Risk Score and Plan: 3 and Ondansetron, Dexamethasone, Midazolam and Treatment may vary due to age or medical condition  Airway Management Planned: Oral ETT  Additional Equipment:   Intra-op Plan:   Post-operative Plan: Extubation in OR  Informed Consent: I have reviewed the patients History and Physical, chart, labs and discussed the procedure including the risks, benefits and alternatives for the proposed anesthesia with the patient or authorized representative who has indicated his/her understanding and acceptance.       Plan Discussed with: CRNA, Anesthesiologist and Surgeon  Anesthesia Plan Comments:         Anesthesia Quick Evaluation

## 2018-09-14 NOTE — Op Note (Signed)
Linda Neal [id number] 01/02/1981 09/14/2018  Laparoscopic Cholecystectomy with IOC Procedure Note  Indications: This patient presents with symptomatic gallbladder disease and will undergo laparoscopic cholecystectomy.  Pre-operative Diagnosis: symptomatic cholelithiasis  Post-operative Diagnosis: Same  Surgeon: Gaynelle AduEric Oktober Glazer MD FACS  Assistants: none  Anesthesia: General endotracheal anesthesia  Procedure Details  The patient was seen again in the Holding Room. The risks, benefits, complications, treatment options, and expected outcomes were discussed with the patient. The possibilities of reaction to medication, pulmonary aspiration, perforation of viscus, bleeding, recurrent infection, finding a normal gallbladder, the need for additional procedures, failure to diagnose a condition, the possible need to convert to an open procedure, and creating a complication requiring transfusion or operation were discussed with the patient. The likelihood of improving the patient's symptoms with return to their baseline status is good.  The patient and/or family concurred with the proposed plan, giving informed consent. The site of surgery properly noted. The patient was taken to Operating Room, identified as Linda Neal and the procedure verified as Laparoscopic Cholecystectomy with Intraoperative Cholangiogram. A Time Out was held and the above information confirmed. Antibiotic prophylaxis was administered.   Prior to the induction of general anesthesia, antibiotic prophylaxis was administered. General endotracheal anesthesia was then administered and tolerated well. After the induction, the abdomen was prepped with Chloraprep and draped in the sterile fashion. The patient was positioned in the supine position.  Local anesthetic agent was injected into the skin near the umbilicus and an incision made. We dissected down to the abdominal fascia with blunt dissection.  The fascia was incised vertically and  we entered the peritoneal cavity bluntly.  A pursestring suture of 0-Vicryl was placed around the fascial opening.  The Hasson cannula was inserted and secured with the stay suture.  Pneumoperitoneum was then created with CO2 and tolerated well without any adverse changes in the patient's vital signs. An 5-mm port was placed in the subxiphoid position.  Two 5-mm ports were placed in the right upper quadrant. All skin incisions were infiltrated with a local anesthetic agent before making the incision and placing the trocars.   We positioned the patient in reverse Trendelenburg, tilted slightly to the patient's left.  The gallbladder was identified, the fundus grasped and retracted cephalad. Adhesions were lysed bluntly and with the electrocautery where indicated, taking care not to injure any adjacent organs or viscus. The infundibulum was grasped and retracted laterally, exposing the peritoneum overlying the triangle of Calot. This was then divided and exposed in a blunt fashion. A critical view of the cystic duct and cystic artery was obtained.  Her cystic artery and node were lateral to her cystic duct. The cystic duct was clearly identified and bluntly dissected circumferentially. A large critical view was achieved. The cystic duct was ligated with a clip distally.   An incision was made in the cystic duct and the [facility] catheter introduced. The catheter was secured using a clip. A cholangiogram was then obtained which showed good visualization of the distal and proximal biliary tree with no sign of filling defects or obstruction.  Contrast flowed easily into the duodenum. The catheter was then removed.   The cystic duct was then ligated with clips and divided. The cystic artery which had been identified & dissected free was ligated with clips and divided as well.   The gallbladder was dissected from the liver bed in retrograde fashion with the electrocautery. The gallbladder was removed and  placed in an Ecco sac.  The gallbladder and Ecco sac were then removed through the umbilical port site. The liver bed was irrigated and inspected. Hemostasis was achieved with the electrocautery. Copious irrigation was utilized and was repeatedly aspirated until clear.  The pursestring suture was used to close the umbilical fascia.  An additional interrupted 0 vicryl was placed at the umbilical fascia using the PMI suture passer with lap guidance since I had to stretch the fascia in order to get the specimen/bag out of the abdomen.  We again inspected the right upper quadrant for hemostasis.  The umbilical closure was inspected and there was no air leak and nothing trapped within the closure. Pneumoperitoneum was released as we removed the trocars.  4-0 Monocryl was used to close the skin.   Dermabond was applied. The patient was then extubated and brought to the recovery room in stable condition. Instrument, sponge, and needle counts were correct at closure and at the conclusion of the case.   Findings: Chronic Cholecystitis with Cholelithiasis Large critical view Normal IOC  Estimated Blood Loss: less than 50 mL         Drains: none         Specimens: Gallbladder           Complications: None; patient tolerated the procedure well.         Disposition: PACU - hemodynamically stable.         Condition: stable  Mary Sella. Andrey Campanile, MD, FACS General, Bariatric, & Minimally Invasive Surgery [facility], [place]

## 2018-09-14 NOTE — Discharge Instructions (Signed)
[facility] °LAPAROSCOPIC SURGERY: POST OP INSTRUCTIONS °Always review your discharge instruction sheet given to you by the facility where your surgery was performed. °IF YOU HAVE DISABILITY OR FAMILY LEAVE FORMS, YOU MUST BRING THEM TO THE OFFICE FOR PROCESSING.   °DO NOT GIVE THEM TO YOUR DOCTOR. ° °PAIN CONTROL ° °1. First take acetaminophen (Tylenol) AND/or ibuprofen (Advil) to control your pain after surgery.  Follow directions on package.  Taking acetaminophen (Tylenol) and/or ibuprofen (Advil) regularly after surgery will help to control your pain and lower the amount of prescription pain medication you may need.  You should not take more than 3,000 mg (3 grams) of acetaminophen (Tylenol) in 24 hours.  You should not take ibuprofen (Advil), aleve, motrin, naprosyn or other NSAIDS if you have a history of stomach ulcers or chronic kidney disease.  °2. A prescription for pain medication may be given to you upon discharge.  Take your pain medication as prescribed, if you still have uncontrolled pain after taking acetaminophen (Tylenol) or ibuprofen (Advil). °3. Use ice packs to help control pain. °4. If you need a refill on your pain medication, please contact your pharmacy.  They will contact our office to request authorization. Prescriptions will not be filled after 5pm or on week-ends. ° °HOME MEDICATIONS °5. Take your usually prescribed medications unless otherwise directed. ° °DIET °6. You should follow a light diet the first few days after arrival home.  Be sure to include lots of fluids daily. Avoid fatty, fried foods.  ° °CONSTIPATION °7. It is common to experience some constipation after surgery and if you are taking pain medication.  Increasing fluid intake and taking a stool softener (such as Colace) will usually help or prevent this problem from occurring.  A mild laxative (Milk of Magnesia or Miralax) should be taken according to package instructions if there are no bowel  movements after 48 hours. ° °WOUND/INCISION CARE °8. Most patients will experience some swelling and bruising in the area of the incisions.  Ice packs will help.  Swelling and bruising can take several days to resolve.  °9. Unless discharge instructions indicate otherwise, follow guidelines below  °a. STERI-STRIPS - you may remove your outer bandages 48 hours after surgery, and you may shower at that time.  You have steri-strips (small skin tapes) in place directly over the incision.  These strips should be left on the skin for 7-10 days.   °b. DERMABOND/SKIN GLUE - you may shower in 24 hours.  The glue will flake off over the next 2-3 weeks. °10. Any sutures or staples will be removed at the office during your follow-up visit. ° °ACTIVITIES °11. You may resume regular (light) daily activities beginning the next day--such as daily self-care, walking, climbing stairs--gradually increasing activities as tolerated.  You may have sexual intercourse when it is comfortable.  Refrain from any heavy lifting or straining until approved by your doctor. °a. You may drive when you are no longer taking prescription pain medication, you can comfortably wear a seatbelt, and you can safely maneuver your car and apply brakes. ° °FOLLOW-UP °12. You should see your doctor in the office for a follow-up appointment approximately 2-3 weeks after your surgery.  You should have been given your post-op/follow-up appointment when your surgery was scheduled.  If you did not receive a post-op/follow-up appointment, make sure that you call for this appointment within a day or two after you arrive home to insure a convenient appointment time. ° °OTHER   INSTRUCTIONS 13. No lifting/pushing/pulling anything greater than 10 lbs for 2 weeks  WHEN TO CALL YOUR DOCTOR: 1. Fever over 101.0 2. Inability to urinate 3. Continued bleeding from incision. 4. Increased pain, redness, or drainage from the incision. 5. Increasing abdominal pain  The  clinic staff is available to answer your questions during regular business hours.  Please dont hesitate to call and ask to speak to one of the nurses for clinical concerns.  If you have a medical emergency, go to the nearest emergency room or call 911.  A surgeon from [facility] is always on call at the hospital. [address], [address], [place], [place]  [address] ? [address] [address], [place], [place]   [address] [phone] ? [phone] ? FAX [phone] Web site: [email or web address]     Managing Your Pain After Surgery Without Opioids    Thank you for participating in our program to help patients manage their pain after surgery without opioids. This is part of our effort to provide you with the best care possible, without exposing you or your family to the risk that opioids pose.  What pain can I expect after surgery? You can expect to have some pain after surgery. This is normal. The pain is typically worse the day after surgery, and quickly begins to get better. Many studies have found that many patients are able to manage their pain after surgery with Over-the-Counter (OTC) medications such as Tylenol and Motrin. If you have a condition that does not allow you to take Tylenol or Motrin, notify your surgical team.  How will I manage my pain? The best strategy for controlling your pain after surgery is around the clock pain control with Tylenol (acetaminophen) and Motrin (ibuprofen or Advil). Alternating these medications with each other allows you to maximize your pain control. In addition to Tylenol and Motrin, you can use heating pads or ice packs on your incisions to help reduce your pain.  How will I alternate your regular strength over-the-counter pain medication? You will take a dose of pain medication every three hours. ; Start by taking 650 mg of Tylenol (2 pills of 325 mg) ; 3 hours later take 600 mg of Motrin (3 pills of 200 mg) ; 3  hours after taking the Motrin take 650 mg of Tylenol ; 3 hours after that take 600 mg of Motrin.   - 1 -  See example - if your first dose of Tylenol is at 12:00 PM   12:00 PM Tylenol 650 mg (2 pills of 325 mg)  3:00 PM Motrin 600 mg (3 pills of 200 mg)  6:00 PM Tylenol 650 mg (2 pills of 325 mg)  9:00 PM Motrin 600 mg (3 pills of 200 mg)  Continue alternating every 3 hours   We recommend that you follow this schedule around-the-clock for at least 3 days after surgery, or until you feel that it is no longer needed. Use the table on the last page of this handout to keep track of the medications you are taking. Important: Do not take more than 3000mg  of Tylenol or 2300mg  of Motrin in a 24-hour period. Do not take ibuprofen/Motrin if you have a history of bleeding stomach ulcers, severe kidney disease, &/or actively taking a blood thinner  What if I still have pain? If you have pain that is not controlled with the over-the-counter pain medications (Tylenol and Motrin or Advil) you might have what we call breakthrough pain. You will receive  a prescription for a small amount of an opioid pain medication such as Oxycodone, Tramadol, or Tylenol with Codeine. Use these opioid pills in the first 24 hours after surgery if you have breakthrough pain. Do not take more than 1 pill every 4-6 hours.  If you still have uncontrolled pain after using all opioid pills, don't hesitate to call our staff using the number provided. We will help make sure you are managing your pain in the best way possible, and if necessary, we can provide a prescription for additional pain medication.   Day 1    Time  Name of Medication Number of pills taken  Amount of Acetaminophen  Pain Level   Comments  AM PM       AM PM       AM PM       AM PM       AM PM       AM PM       AM PM       AM PM       Total Daily amount of Acetaminophen Do not take more than  3,000 mg per day      Day 2    Time  Name of  Medication Number of pills taken  Amount of Acetaminophen  Pain Level   Comments  AM PM       AM PM       AM PM       AM PM       AM PM       AM PM       AM PM       AM PM       Total Daily amount of Acetaminophen Do not take more than  3,000 mg per day      Day 3    Time  Name of Medication Number of pills taken  Amount of Acetaminophen  Pain Level   Comments  AM PM       AM PM       AM PM       AM PM          AM PM       AM PM       AM PM       AM PM       Total Daily amount of Acetaminophen Do not take more than  3,000 mg per day      Day 4    Time  Name of Medication Number of pills taken  Amount of Acetaminophen  Pain Level   Comments  AM PM       AM PM       AM PM       AM PM       AM PM       AM PM       AM PM       AM PM       Total Daily amount of Acetaminophen Do not take more than  3,000 mg per day      Day 5    Time  Name of Medication Number of pills taken  Amount of Acetaminophen  Pain Level   Comments  AM PM       AM PM       AM PM       AM PM       AM PM  AM PM       AM PM       AM PM       Total Daily amount of Acetaminophen Do not take more than  3,000 mg per day       Day 6    Time  Name of Medication Number of pills taken  Amount of Acetaminophen  Pain Level  Comments  AM PM       AM PM       AM PM       AM PM       AM PM       AM PM       AM PM       AM PM       Total Daily amount of Acetaminophen Do not take more than  3,000 mg per day      Day 7    Time  Name of Medication Number of pills taken  Amount of Acetaminophen  Pain Level   Comments  AM PM       AM PM       AM PM       AM PM       AM PM       AM PM       AM PM       AM PM       Total Daily amount of Acetaminophen Do not take more than  3,000 mg per day        For additional information about how and where to safely dispose of unused opioid medications - https://www.morepowerfulnc.org  Disclaimer: This  document contains information and/or instructional materials adapted from [facility] for the typical patient with your condition. It does not replace medical advice from your health care provider because your experience may differ from that of the typical patient. Talk to your health care provider if you have any questions about this document, your condition or your treatment plan. Adapted from [facility]   

## 2018-09-15 ENCOUNTER — Encounter (HOSPITAL_COMMUNITY): Payer: Self-pay | Admitting: General Surgery

## 2019-09-21 IMAGING — RF INTRAOPERATIVE CHOLANGIOGRAM
1 series · 4 of 4 positions shown · non-contrast
Comparison: Abdominal ultrasound-04/14/2018

CLINICAL DATA: Intraoperative cholangiogram during laparoscopic
cholecystectomy.

EXAM:
INTRAOPERATIVE CHOLANGIOGRAM
FLUOROSCOPY TIME:  5 seconds

[Series 1: unknown protocol · 0.20mm/px · 4 of 19 frames shown]
[frame 3/19]
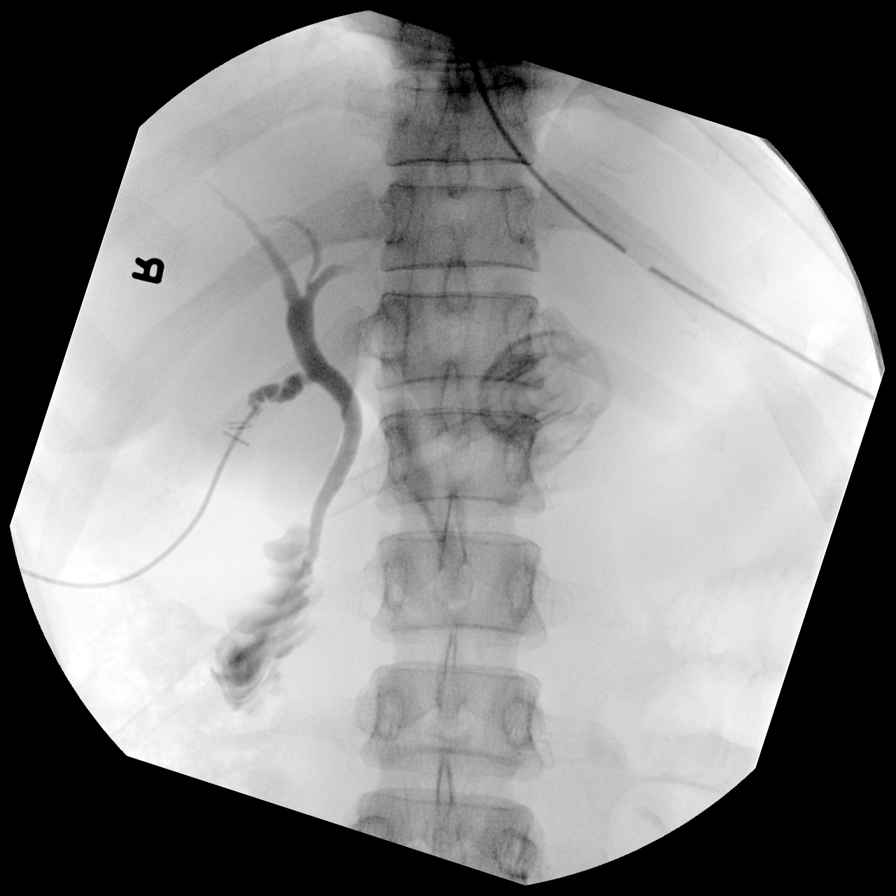
[frame 10/19]
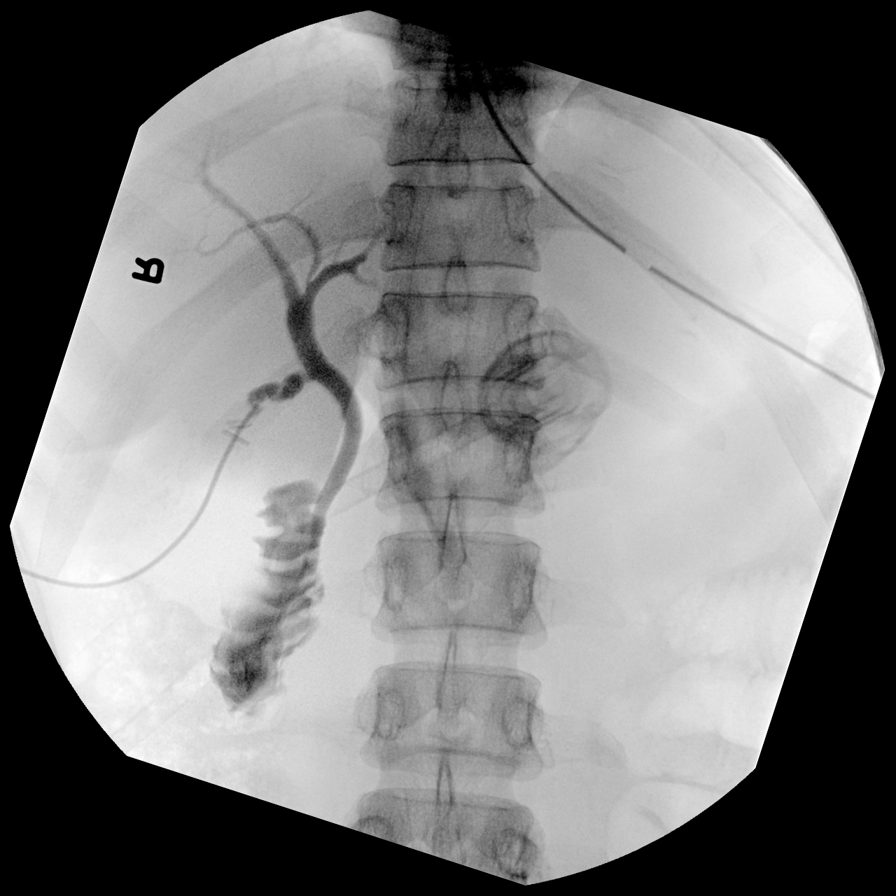
[frame 16/19]
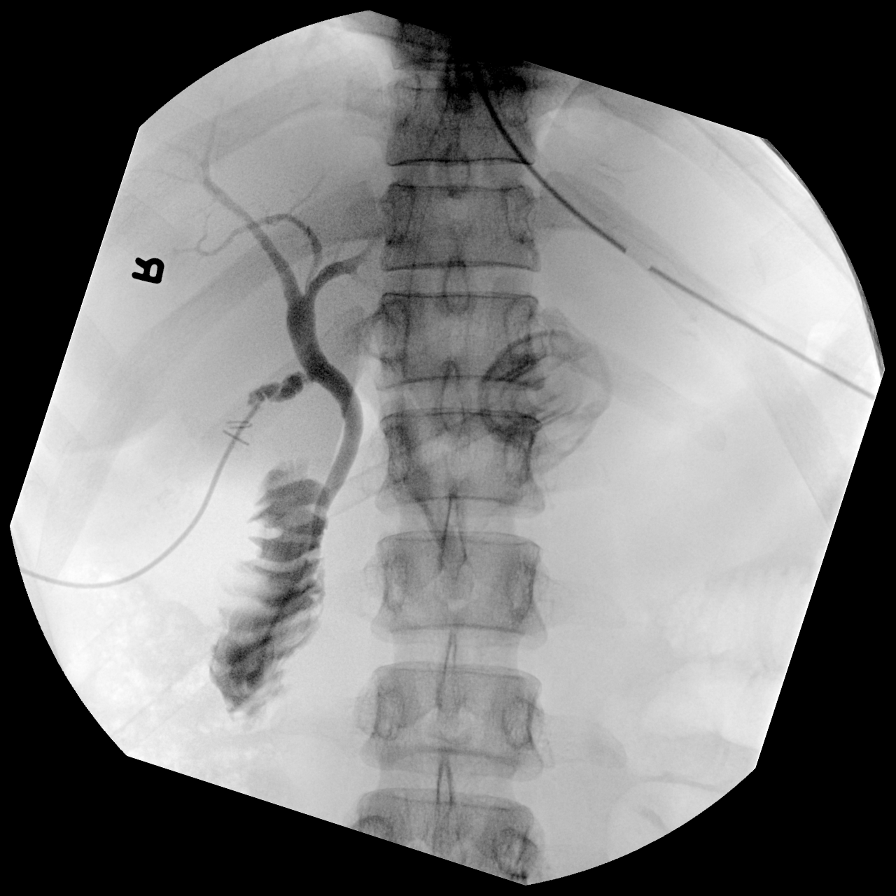
[frame 17/19]
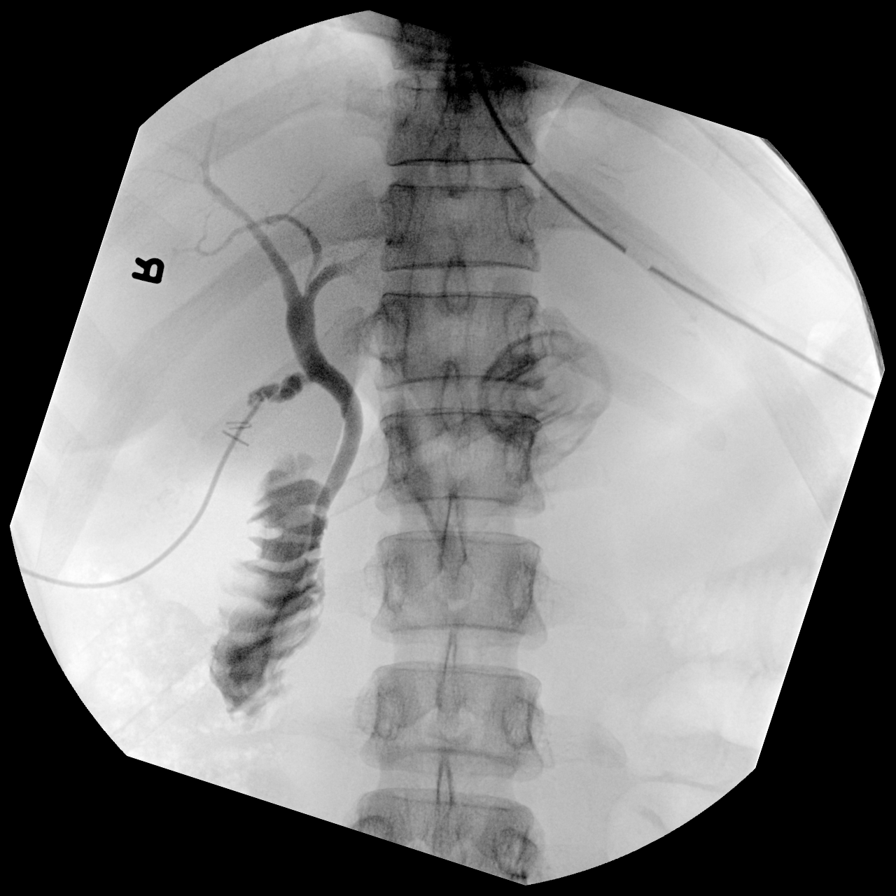

[4 of 4 positions shown; findings below may reference images not displayed]

FINDINGS: Intraoperative cholangiographic images of the right upper abdominal
quadrant during laparoscopic cholecystectomy are provided for
review.

Surgical clips overlie the expected location of the gallbladder
fossa.

Contrast injection demonstrates selective cannulation of the central
aspect of the cystic duct.

There is passage of contrast through the central aspect of the
cystic duct with filling of a non dilated common bile duct. There is
passage of contrast though the CBD and into the descending portion
of the duodenum.

There is minimal reflux of injected contrast into the common hepatic
duct and central aspect of the non dilated intrahepatic biliary
system.

There are no discrete filling defects within the opacified portions
of the biliary system to suggest the presence of
choledocholithiasis.
IMPRESSION: No evidence of choledocholithiasis.

## 2020-09-11 IMAGING — US US ABDOMEN COMPLETE
1 series · 13 of 25 positions shown · non-contrast
Comparison: None.

CLINICAL DATA: Abdominal pain for 2 weeks

EXAM:
ABDOMEN ULTRASOUND COMPLETE

[Series 1: us abdomen complete · 0.15mm/px · 13 of 91 slices shown]
[im 1/91]
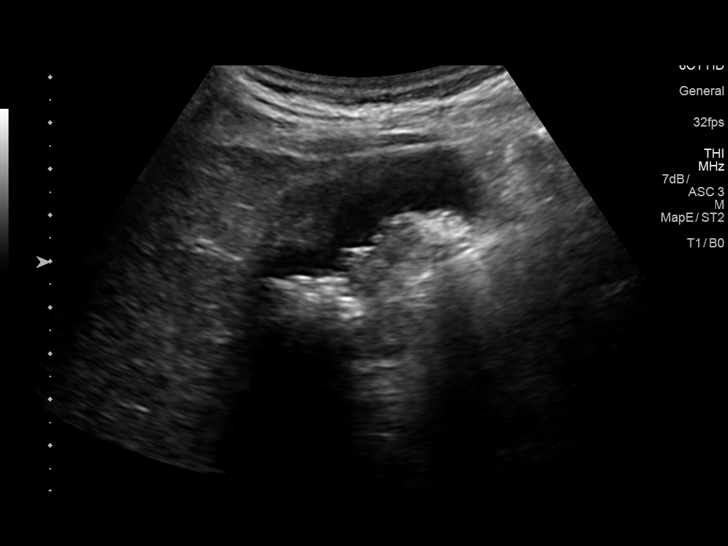
[im 8/91]
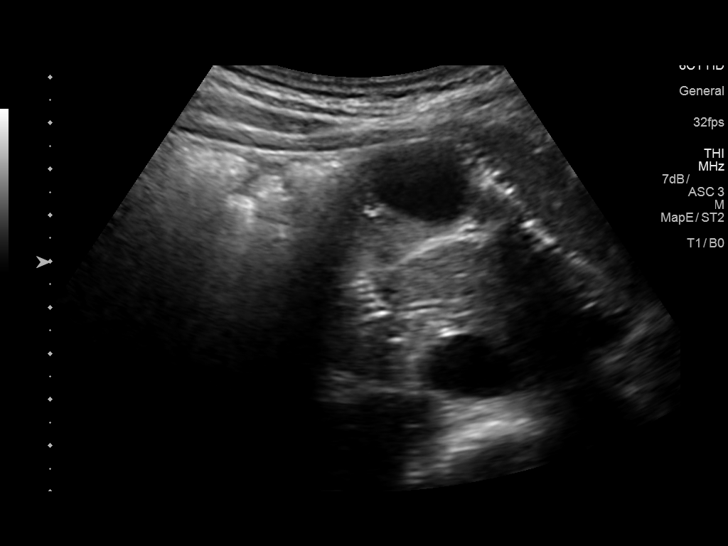
[im 16/91]
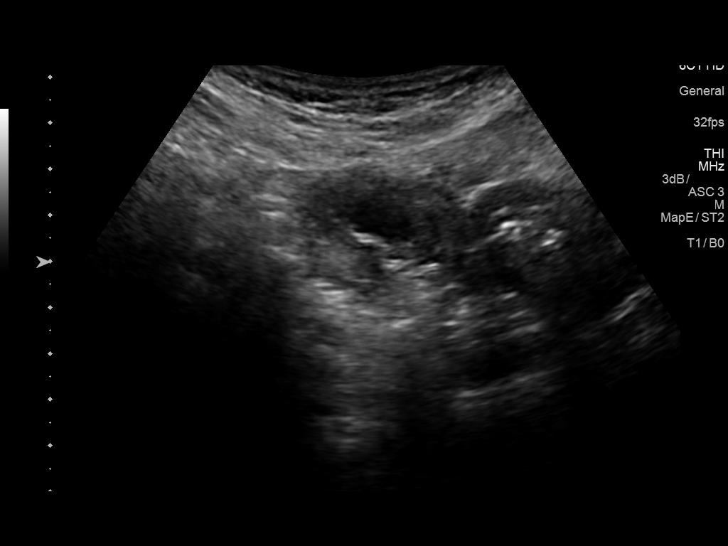
[im 23/91]
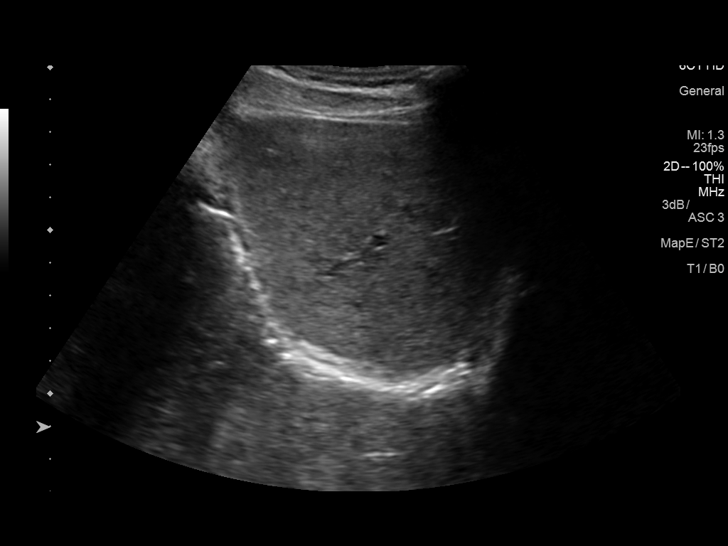
[im 31/91]
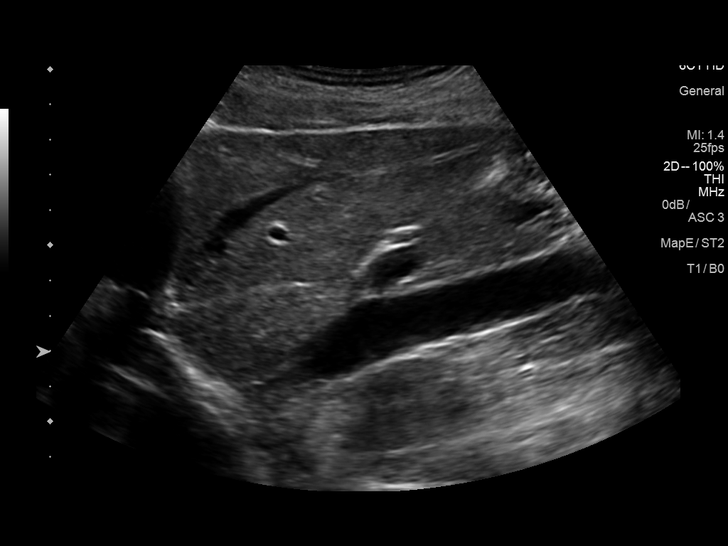
[im 38/91]
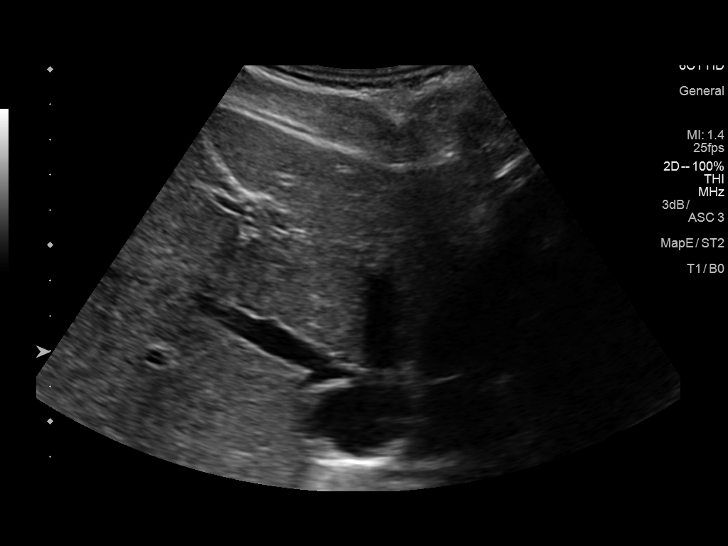
[im 46/91]
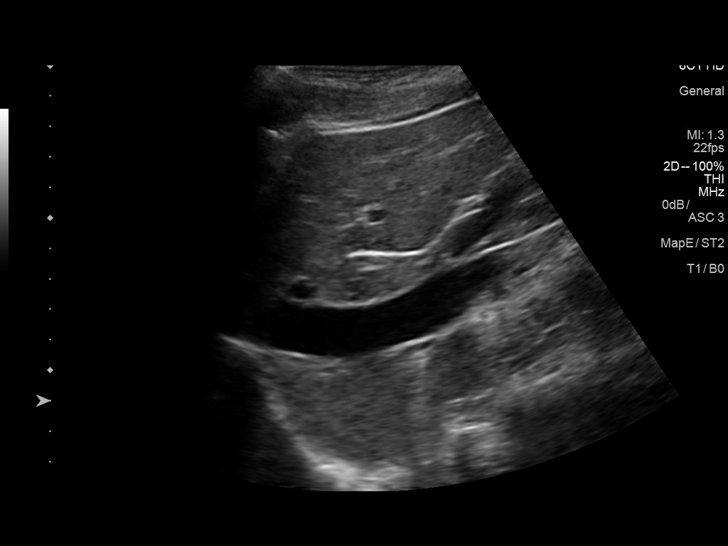
[im 53/91]
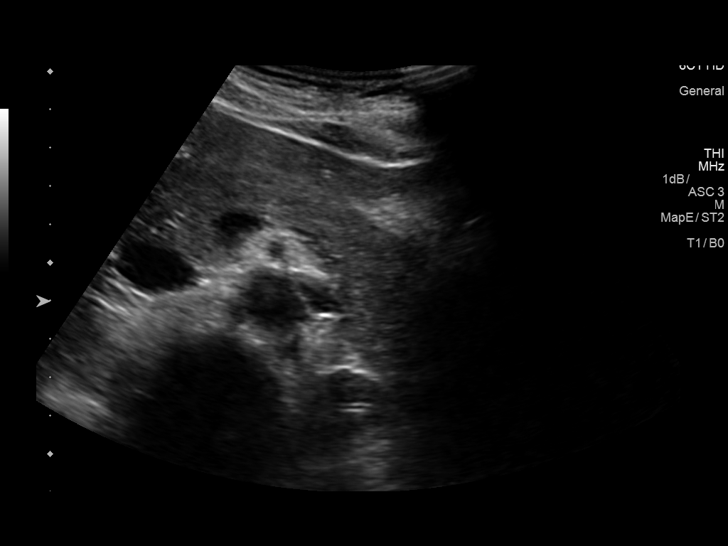
[im 61/91]
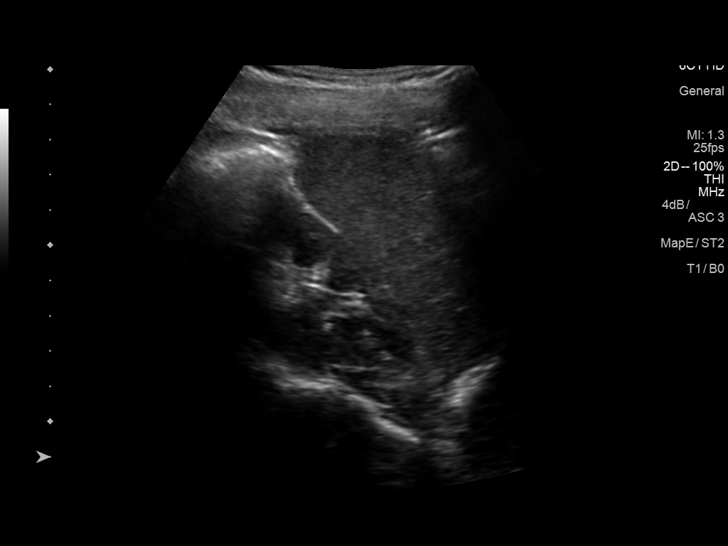
[im 68/91]
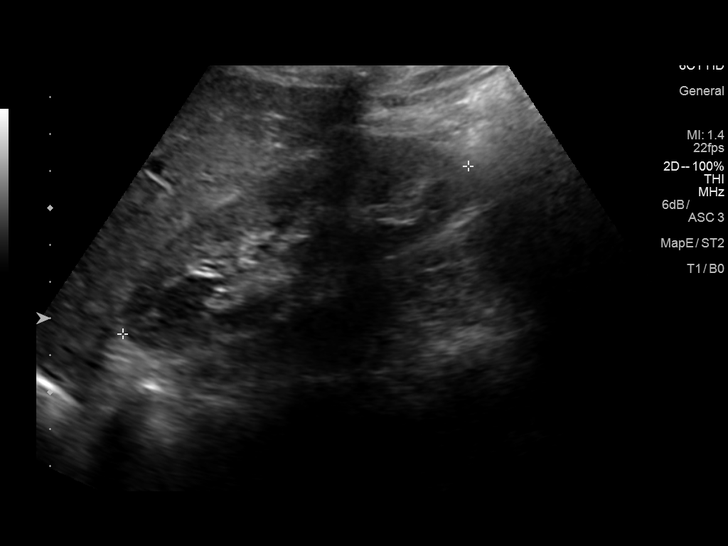
[im 76/91]
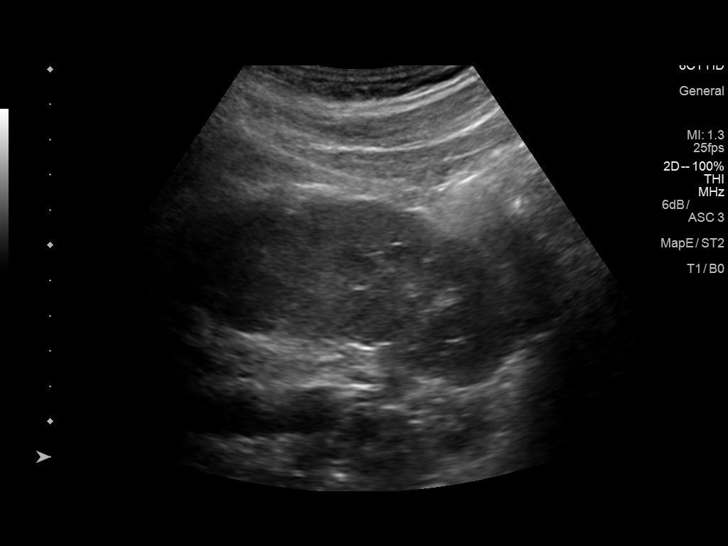
[im 83/91]
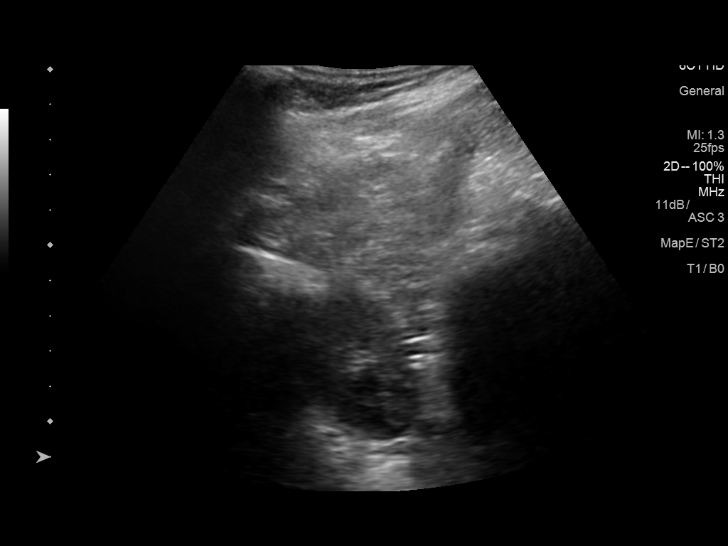
[im 91/91]
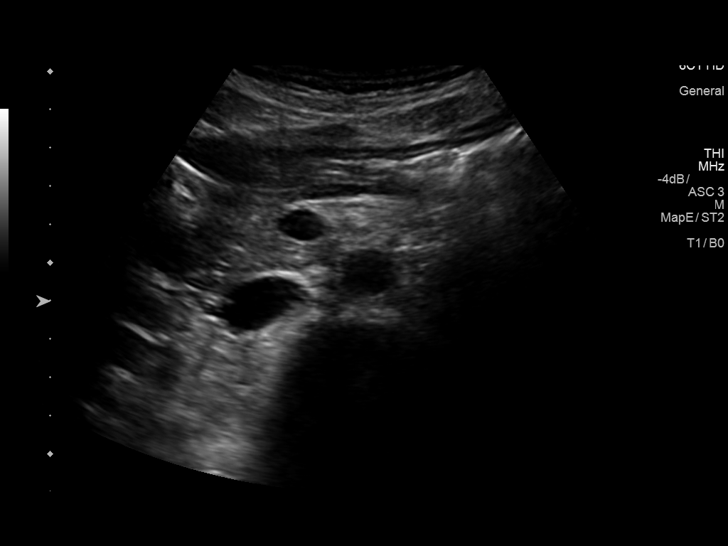

[13 of 25 positions shown; findings below may reference images not displayed]

FINDINGS: Gallbladder: Multiple small gallstones are present. The largest is
0.4 cm in size. The gallbladder wall is markedly thickened,
measuring up to 0.8 cm in thickness. It is somewhat irregular. There
is ring down artifact within the gallbladder wall suggesting
cholesterol deposition. Sonographic Murphy sign is negative. No
pericholecystic fluid.

Common bile duct: Diameter: 2 mm in caliber.

Liver: No focal lesion identified. Within normal limits in
parenchymal echogenicity. Portal vein is patent on color Doppler
imaging with normal direction of blood flow towards the liver.

IVC: No abnormality visualized.

Pancreas: Visualized portion unremarkable.

Spleen: Size and appearance within normal limits.

Right Kidney: Length: 10.4 cm. Echogenicity within normal limits. No
mass or hydronephrosis visualized.

Left Kidney: Length: 10.5 cm. Echogenicity within normal limits. No
mass or hydronephrosis visualized.

Abdominal aorta: 1.8 cm

Other findings: None.
IMPRESSION: Cholelithiasis.

Nonspecific gallbladder wall thickening. Ring down artifact suggests
cholesterolosis in the gallbladder wall. Please note that the
sonographic Murphy sign was negative. Correlate clinically as for
the need for nuclear medicine imaging.
